# Patient Record
Sex: Female | Born: 1970 | Race: White | Hispanic: No | Marital: Single | Smoking: Never smoker
Health system: Southern US, Community
[De-identification: ages and names within clinical notes are randomized; demographics above are authoritative.]

## PROBLEM LIST (undated history)

## (undated) DIAGNOSIS — I639 Cerebral infarction, unspecified: Secondary | ICD-10-CM

## (undated) HISTORY — PX: KNEE SURGERY: SHX244

## (undated) HISTORY — PX: TONSILLECTOMY: SUR1361

---

## 2014-02-17 ENCOUNTER — Emergency Department (HOSPITAL_BASED_OUTPATIENT_CLINIC_OR_DEPARTMENT_OTHER)
Admission: EM | Admit: 2014-02-17 | Discharge: 2014-02-17 | Disposition: A | Discharge status: Home / Self Care | Payer: Worker's Compensation | Attending: Emergency Medicine | Admitting: Emergency Medicine

## 2014-02-17 ENCOUNTER — Encounter (HOSPITAL_BASED_OUTPATIENT_CLINIC_OR_DEPARTMENT_OTHER): Payer: Self-pay | Admitting: Emergency Medicine

## 2014-02-17 ENCOUNTER — Emergency Department (HOSPITAL_BASED_OUTPATIENT_CLINIC_OR_DEPARTMENT_OTHER): Payer: Worker's Compensation

## 2014-02-17 DIAGNOSIS — Y9389 Activity, other specified: Secondary | ICD-10-CM | POA: Insufficient documentation

## 2014-02-17 DIAGNOSIS — Y998 Other external cause status: Secondary | ICD-10-CM | POA: Diagnosis not present

## 2014-02-17 DIAGNOSIS — Y9289 Other specified places as the place of occurrence of the external cause: Secondary | ICD-10-CM | POA: Insufficient documentation

## 2014-02-17 DIAGNOSIS — S01411A Laceration without foreign body of right cheek and temporomandibular area, initial encounter: Secondary | ICD-10-CM | POA: Diagnosis not present

## 2014-02-17 DIAGNOSIS — W540XXA Bitten by dog, initial encounter: Secondary | ICD-10-CM | POA: Insufficient documentation

## 2014-02-17 HISTORY — DX: Cerebral infarction, unspecified: I63.9

## 2014-02-17 LAB — BASIC METABOLIC PANEL
Anion gap: 13 (ref 5–15)
BUN: 20 mg/dL (ref 6–23)
CO2: 25 mEq/L (ref 19–32)
Calcium: 9.3 mg/dL (ref 8.4–10.5)
Chloride: 102 mEq/L (ref 96–112)
Creatinine, Ser: 0.8 mg/dL (ref 0.50–1.10)
GFR calc Af Amer: >90 mL/min (ref >90)
GFR calc non Af Amer: 88 mL/min — ABNORMAL LOW (ref >90)
GLUCOSE: 102 mg/dL — AB (ref 70–99)
POTASSIUM: 4 meq/L (ref 3.7–5.3)
SODIUM: 140 meq/L (ref 137–147)

## 2014-02-17 LAB — CBC WITH DIFFERENTIAL/PLATELET
BASOS ABS: 0 10*3/uL (ref 0.0–0.1)
Basophils Relative: 0 % (ref 0–1)
EOS ABS: 0.4 10*3/uL (ref 0.0–0.7)
Eosinophils Relative: 4 % (ref 0–5)
HCT: 39.7 % (ref 36.0–46.0)
Hemoglobin: 13.3 g/dL (ref 12.0–15.0)
LYMPHS ABS: 1.3 10*3/uL (ref 0.7–4.0)
LYMPHS PCT: 16 % (ref 12–46)
MCH: 30 pg (ref 26.0–34.0)
MCHC: 33.5 g/dL (ref 30.0–36.0)
MCV: 89.6 fL (ref 78.0–100.0)
Monocytes Absolute: 0.4 10*3/uL (ref 0.1–1.0)
Monocytes Relative: 5 % (ref 3–12)
NEUTROS PCT: 75 % (ref 43–77)
Neutro Abs: 6.3 10*3/uL (ref 1.7–7.7)
PLATELETS: 255 10*3/uL (ref 150–400)
RBC: 4.43 MIL/uL (ref 3.87–5.11)
RDW: 12.3 % (ref 11.5–15.5)
WBC: 8.3 10*3/uL (ref 4.0–10.5)

## 2014-02-17 MED ORDER — TETANUS-DIPHTH-ACELL PERTUSSIS 5-2.5-18.5 LF-MCG/0.5 IM SUSP
0.5000 mL | Freq: Once | INTRAMUSCULAR | Status: AC
  Administered 2014-02-17: 0.5 mL via INTRAMUSCULAR
  Filled 2014-02-17: qty 0.5

## 2014-02-17 MED ORDER — MORPHINE SULFATE 4 MG/ML IJ SOLN
4.0000 mg | Freq: Once | INTRAMUSCULAR | Status: AC
  Administered 2014-02-17: 4 mg via INTRAVENOUS
  Filled 2014-02-17: qty 1

## 2014-02-17 MED ORDER — TRAMADOL HCL 50 MG PO TABS: 50.0000 mg | ORAL_TABLET | Freq: Four times a day (QID) | ORAL | Status: AC | PRN

## 2014-02-17 MED ORDER — MORPHINE SULFATE 4 MG/ML IJ SOLN
6.0000 mg | Freq: Once | INTRAMUSCULAR | Status: AC
  Administered 2014-02-17: 6 mg via INTRAVENOUS
  Filled 2014-02-17: qty 2

## 2014-02-17 MED ORDER — LIDOCAINE-EPINEPHRINE 1 %-1:100000 IJ SOLN
10.0000 mL | Freq: Once | INTRAMUSCULAR | Status: AC
  Administered 2014-02-17: 10 mL
  Filled 2014-02-17: qty 1

## 2014-02-17 MED ORDER — SODIUM CHLORIDE 0.9 % IV SOLN
3.0000 g | Freq: Once | INTRAVENOUS | Status: AC
  Administered 2014-02-17: 3 g via INTRAVENOUS
  Filled 2014-02-17: qty 3

## 2014-02-17 MED ORDER — KETOROLAC TROMETHAMINE 15 MG/ML IJ SOLN
15.0000 mg | Freq: Once | INTRAMUSCULAR | Status: AC
  Administered 2014-02-17: 15 mg via INTRAVENOUS
  Filled 2014-02-17: qty 1

## 2014-02-17 MED ORDER — ONDANSETRON HCL 4 MG/2ML IJ SOLN
4.0000 mg | Freq: Once | INTRAMUSCULAR | Status: AC
  Administered 2014-02-17: 4 mg via INTRAVENOUS
  Filled 2014-02-17: qty 2

## 2014-02-17 MED ORDER — AMOXICILLIN-POT CLAVULANATE 875-125 MG PO TABS: 1.0000 | ORAL_TABLET | Freq: Two times a day (BID) | ORAL | Status: AC

## 2014-02-17 MED ORDER — SODIUM CHLORIDE 0.9 % IV SOLN
INTRAVENOUS | Status: DC
  Administered 2014-02-17: 13:00:00 via INTRAVENOUS

## 2014-02-17 MED ORDER — FENTANYL CITRATE 0.05 MG/ML IJ SOLN
50.0000 ug | Freq: Once | INTRAMUSCULAR | Status: AC
  Administered 2014-02-17: 50 ug via INTRAVENOUS
  Filled 2014-02-17: qty 2

## 2014-02-17 NOTE — ED Notes (Signed)
Pt works at [employer] and was bit by [employer]-multiple lacs to face

## 2014-02-17 NOTE — ED Notes (Signed)
Updated patient and family on plan of care and time table for surgeon to arrive, patient in NAD at this time , family at bedside

## 2014-02-17 NOTE — ED Notes (Signed)
Per care[facility]: pt transfer from med center for suture of face following dog bite today, pt reports that she was bringing a dog from the vet when she was bit in the face by the dog. Dog was up to date with all vaccinations. Pt axo x4, face bandage and bleeding controlled. nad noted.

## 2014-02-17 NOTE — ED Provider Notes (Signed)
Medical screening examination/treatment/procedure(s) were performed by non-physician practitioner and as supervising physician I was immediately available for consultation/collaboration.   EKG Interpretation None        Rolan BuccoMelanie Siomara Burkel, MD 02/17/14 1615

## 2014-02-17 NOTE — ED Notes (Signed)
PA and this RN at bedside with patient

## 2014-02-17 NOTE — Discharge Instructions (Signed)
Keep the face clean and dry.    Animal Bite An animal bite can result in a scratch on the skin, deep open cut, puncture of the skin, crush injury, or tearing away of the skin or a body part. Dogs are responsible for most animal bites. Children are bitten more often than adults. An animal bite can range from very mild to more serious. A small bite from your house pet is no cause for alarm. However, some animal bites can become infected or injure a bone or other tissue. You must seek medical care if:  The skin is broken and bleeding does not slow down or stop after 15 minutes.  The puncture is deep and difficult to clean (such as a cat bite).  Pain, warmth, redness, or pus develops around the wound.  The bite is from a stray animal or rodent. There may be a risk of rabies infection.  The bite is from a snake, raccoon, skunk, fox, coyote, or bat. There may be a risk of rabies infection.  The person bitten has a chronic illness such as diabetes, liver disease, or cancer, or the person takes medicine that lowers the immune system.  There is concern about the location and severity of the bite. It is important to clean and protect an animal bite wound right away to prevent infection. Follow these steps:  Clean the wound with plenty of water and soap.  Apply an antibiotic cream.  Apply gentle pressure over the wound with a clean towel or gauze to slow or stop bleeding.  Elevate the affected area above the heart to help stop any bleeding.  Seek medical care. Getting medical care within 8 hours of the animal bite leads to the best possible outcome. DIAGNOSIS  Your caregiver will most likely:  Take a detailed history of the animal and the bite injury.  Perform a wound exam.  Take your medical history. Blood tests or X-rays may be performed. Sometimes, infected bite wounds are cultured and sent to a lab to identify the infectious bacteria.  TREATMENT  Medical treatment will depend on the  location and type of animal bite as well as the patient's medical history. Treatment may include:  Wound care, such as cleaning and flushing the wound with saline solution, bandaging, and elevating the affected area.  Antibiotics.  Tetanus immunization.  Rabies immunization.  Leaving the wound open to heal. This is often done with animal bites, due to the high risk of infection. However, in certain cases, wound closure with stitches, wound adhesive, skin adhesive strips, or staples may be used. Infected bites that are left untreated may require intravenous (IV) antibiotics and surgical treatment in the hospital. HOME CARE INSTRUCTIONS  Follow your caregiver's instructions for wound care.  Take all medicines as directed.  If your caregiver prescribes antibiotics, take them as directed. Finish them even if you start to feel better.  Follow up with your caregiver for further exams or immunizations as directed. You may need a tetanus shot if:  You cannot remember when you had your last tetanus shot.  You have never had a tetanus shot.  The injury broke your skin. If you get a tetanus shot, your arm may swell, get red, and feel warm to the touch. This is common and not a problem. If you need a tetanus shot and you choose not to have one, there is a rare chance of getting tetanus. Sickness from tetanus can be serious. SEEK MEDICAL CARE IF:  You notice  warmth, redness, soreness, swelling, pus discharge, or a bad smell coming from the wound.  You have a red line on the skin coming from the wound.  You have a fever, chills, or a general ill feeling.  You have nausea or vomiting.  You have continued or worsening pain.  You have trouble moving the injured part.  You have other questions or concerns. MAKE SURE YOU:  Understand these instructions.  Will watch your condition.  Will get help right away if you are not doing well or get worse. Document Released: 01/04/2011 Document  Revised: 07/11/2011 Document Reviewed: 01/04/2011 [facility] Patient Information 2015 [employer] [place]. This information is not intended to replace advice given to you by your health care provider. Make sure you discuss any questions you have with your health care provider.

## 2014-02-17 NOTE — ED Notes (Signed)
Removed bandages and reapplied to face with saline.

## 2014-02-17 NOTE — Consult Note (Signed)
Reason for Consult: Facial laceration Referring Physician: Virgel Manifold, MD  Bethany Bauer is an 43 y.o. female.  HPI: She was bitten by a dog earlier in the day at work. Multiple lacerations of the right side of the face.  Past Medical History  Diagnosis Date  . Stroke     Past Surgical History  Procedure Laterality Date  . Tonsillectomy    . Knee surgery      No family history on file.  Social History:  reports that she has never smoked. She does not have any smokeless tobacco history on file. She reports that she does not drink alcohol or use illicit drugs.  Allergies:  Allergies  Allergen Reactions  . Sulfa Antibiotics Rash    Medications: Reviewed  Results for orders placed during the hospital encounter of 02/17/14 (from the past 48 hour(s))  CBC WITH DIFFERENTIAL     Status: None   Collection Time    02/17/14 12:50 PM      Result Value Ref Range   WBC 8.3  4.0 - 10.5 K/uL   RBC 4.43  3.87 - 5.11 MIL/uL   Hemoglobin 13.3  12.0 - 15.0 g/dL   HCT 39.7  36.0 - 46.0 %   MCV 89.6  78.0 - 100.0 fL   MCH 30.0  26.0 - 34.0 pg   MCHC 33.5  30.0 - 36.0 g/dL   RDW 12.3  11.5 - 15.5 %   Platelets 255  150 - 400 K/uL   Neutrophils Relative % 75  43 - 77 %   Neutro Abs 6.3  1.7 - 7.7 K/uL   Lymphocytes Relative 16  12 - 46 %   Lymphs Abs 1.3  0.7 - 4.0 K/uL   Monocytes Relative 5  3 - 12 %   Monocytes Absolute 0.4  0.1 - 1.0 K/uL   Eosinophils Relative 4  0 - 5 %   Eosinophils Absolute 0.4  0.0 - 0.7 K/uL   Basophils Relative 0  0 - 1 %   Basophils Absolute 0.0  0.0 - 0.1 K/uL  BASIC METABOLIC PANEL     Status: Abnormal   Collection Time    02/17/14 12:50 PM      Result Value Ref Range   Sodium 140  137 - 147 mEq/L   Potassium 4.0  3.7 - 5.3 mEq/L   Chloride 102  96 - 112 mEq/L   CO2 25  19 - 32 mEq/L   Glucose, Bld 102 (*) 70 - 99 mg/dL   BUN 20  6 - 23 mg/dL   Creatinine, Ser 0.80  0.50 - 1.10 mg/dL   Calcium 9.3  8.4 - 10.5 mg/dL   GFR calc non Af Amer 88  (*) >90 mL/min   GFR calc Af Amer >90  >90 mL/min   Comment: (NOTE)     The eGFR has been calculated using the CKD EPI equation.     This calculation has not been validated in all clinical situations.     eGFR's persistently <90 mL/min signify possible Chronic Kidney     Disease.   Anion gap 13  5 - 15    Ct Maxillofacial Wo Cm  02/17/2014   CLINICAL DATA:  Dog bite in the right malar region with multiple lacerations  EXAM: CT MAXILLOFACIAL WITHOUT CONTRAST  TECHNIQUE: Multidetector CT imaging of the maxillofacial structures was performed. Multiplanar CT image reconstructions were also generated. A small metallic BB was placed on the right temple in order  to reliably differentiate right from left.  COMPARISON:  None.  FINDINGS: There is soft tissue swelling and soft tissue gas over the right malar region extending to the level of the right lip. The soft tissues of the nose are mildly edematous to the right of midline. The nasal bones and nasal septum are intact. The walls of the right maxillary sinus and of the right orbit are intact. The mandible is intact.  There is mucoperiosteal thickening within both maxillary sinuses greater on the right than on the left. A trace of fluid on the right is suspected as well. The other paranasal sinuses are clear. The soft tissues of the right orbit are intact. There is no pre- or postseptal edema.  IMPRESSION: 1. There is significant soft tissue injury over the right malar region with soft tissue gas noted. No underlying bony fracture is demonstrated. No orbital injury is demonstrated. 2. There is mucoperiosteal thickening within the maxillary sinuses without evidence of disruption of the bony sinus walls.   Electronically Signed   By: David  Martinique   On: 02/17/2014 13:52    ZOX:[id number] except as listed in admit H&P  Blood pressure 130/65, pulse 77, temperature 98 F (36.7 C), temperature source Oral, resp. rate 14, height 5' 6" (1.676 m), weight 70.308 kg  (155 lb), last menstrual period 02/15/2014, SpO2 99.00%.  PHYSICAL EXAM: Overall appearance:  Healthy appearing, in no distress Head:  Normocephalic, atraumatic. Ears: External ears look normal. Nose: External nose is healthy in appearance. Internal nasal exam free of any lesions or obstruction. Oral Cavity:  There are no mucosal lesions or masses identified. Face: Multiple lacerations involving the right side of the face. 3 of them were deep enough to require closure. The total length of the 3 lacerations is approximately 4 cm.  Neuro:  No identifiable neurologic deficits. Neck: No palpable neck masses.  Studies Reviewed: Maxillofacial CT  Procedures: Closure of these lacerations.  Procedure: All of the lacerations were infiltrated with 1% Xylocaine with epinephrine. 5-0 Monocryl was used to close all of them in a running fashion. There is no loss of tissue. The Monocryl was also used to ligate a small vessel in the most inferior laceration. Benzoin and Steri-Strips were applied on top as well to facilitate closure. She tolerated this well.   Assessment/Plan: Instructions given to keep everything clean. She is up-to-date on tetanus vaccination. She'll be prescribed Augmentin. She'll followup with me in one week.  [name] 02/17/2014, 7:55 PM

## 2014-02-17 NOTE — ED Notes (Signed)
Pt given sterile gauze to hold on face, small amt active bleeding noted from lacerations on face. Pt is in nad, states her pain is 5/10. Suture cart placed at bedside.

## 2014-02-17 NOTE — ED Provider Notes (Signed)
CSN: [id number]     Arrival date & time 02/17/14  1144 History   First MD Initiated Contact with Patient 02/17/14 1210     Chief Complaint  Patient presents with  . Animal Bite     (Consider location/radiation/quality/duration/timing/severity/associated sxs/prior Treatment) HPI  Patient to the ER with complaints of dog bite to the face by a Pit Mix. She was sitting in a car with the dog when it got scared and attacked her. She reports having significant pain and discomfort to the right cheek. She denies having any pain or injury to the eye, nose or inside of her mouth. Denies change of vision.  Past Medical History  Diagnosis Date  . Stroke    Past Surgical History  Procedure Laterality Date  . Tonsillectomy    . Knee surgery     No family history on file. History  Substance Use Topics  . Smoking status: Never Smoker   . Smokeless tobacco: Not on file  . Alcohol Use: No   OB History   Grav Para Term Preterm Abortions TAB SAB Ect Mult Living                 Review of Systems  All other systems reviewed and are negative.     Allergies  Sulfa antibiotics  Home Medications   Prior to Admission medications   Not on File   BP 151/82  Pulse 108  Temp(Src) 98.5 F (36.9 C) (Oral)  Resp 18  Ht 5\' 6"  (1.676 m)  Wt 155 lb (70.308 kg)  BMI 25.03 kg/m2  SpO2 97%  LMP 02/15/2014 Physical Exam  Nursing note and vitals reviewed. Constitutional: She appears well-developed and well-nourished. No distress.  HENT:  Head: Normocephalic and atraumatic.  Right Ear: Tympanic membrane and ear canal normal.  Left Ear: Tympanic membrane and ear canal normal.  Nose: Nose normal.  Mouth/Throat: Uvula is midline and oropharynx is clear and moist.  No intraoral lesions or injury. Multiple lacerations to right cheek without loss of tissue  Eyes: Conjunctivae, EOM and lids are normal. Pupils are equal, round, and reactive to light.  No injury to eye on left or right.  Neck:  Normal range of motion. Neck supple. No spinous process tenderness and no muscular tenderness present.  Cardiovascular: Normal rate and regular rhythm.   Pulmonary/Chest: Effort normal.  Abdominal: Soft.  Neurological: She is alert.  Skin: Skin is warm and dry.        ED Course  Procedures (including critical care time) Labs Review Labs Reviewed  BASIC METABOLIC PANEL - Abnormal; Notable for the following:    Glucose, Bld 102 (*)    GFR calc non Af Amer 88 (*)    All other components within normal limits  CBC WITH DIFFERENTIAL    Imaging Review No results found.   EKG Interpretation None      MDM   Final diagnoses:  Dog bite   Medications  0.9 %  sodium chloride infusion ( Intravenous New Bag/Given 02/17/14 1300)  ondansetron (ZOFRAN) injection 4 mg (4 mg Intravenous Given 02/17/14 1309)  fentaNYL (SUBLIMAZE) injection 50 mcg (50 mcg Intravenous Given 02/17/14 1310)  Tdap (BOOSTRIX) injection 0.5 mL (0.5 mLs Intramuscular Given 02/17/14 1312)     I spoke with Dr. Pollyann Kennedyosen of [facility] and [facility] WIll transfer her over. CT maxillofacial done here at [facility]. Please call Dr. Pollyann Kennedyosen when she arrives at the ED.  Filed Vitals:  02/17/14 1155  BP: 151/82  Pulse: 108  Temp: 98.5 F (36.9 C)  Resp: [id number]    Dorthula Matasiffany G Lynsi Dooner, PA-C 02/17/14 1336

## 2016-08-04 IMAGING — CT CT MAXILLOFACIAL W/O CM
3 series · 15 of 47 positions shown, 18 images · non-contrast
Comparison: None.

CLINICAL DATA: Dog bite in the right malar region with multiple
lacerations

EXAM:
CT MAXILLOFACIAL WITHOUT CONTRAST
TECHNIQUE: Multidetector CT imaging of the maxillofacial structures was
performed. Multiplanar CT image reconstructions were also generated.
A small metallic BB was placed on the right temple in order to
reliably differentiate right from left.

[Series 3: maxillofacial 2.0 h30s st · axial · 0.33mm/px · z∈[+1019,+1155]mm · 9 of 80 slices shown, 12 images]
[im 6/80  brain]
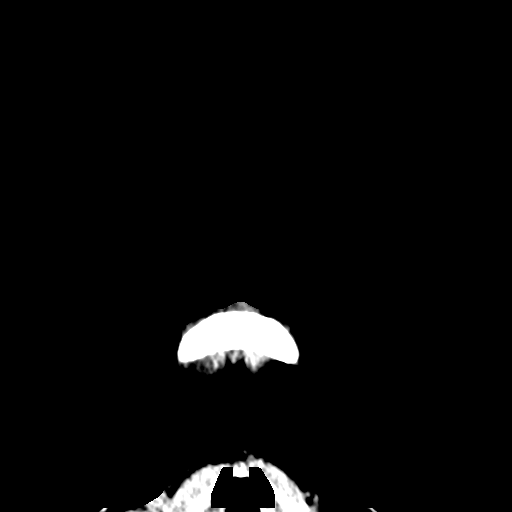
[im 6/80  bone]
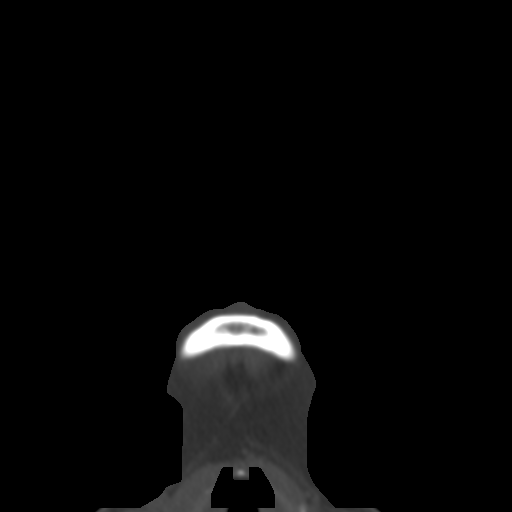
[im 14/80  bone]
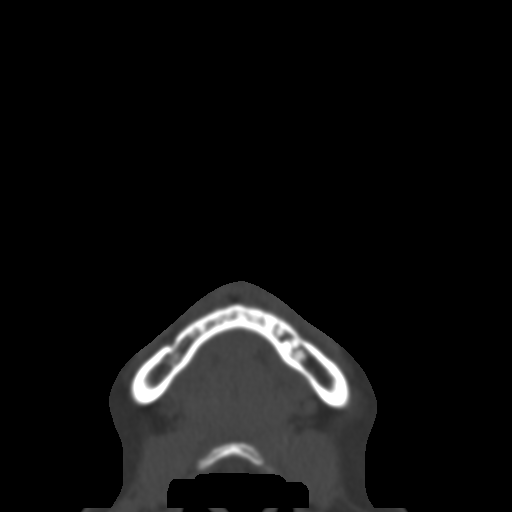
[im 22/80  bone]
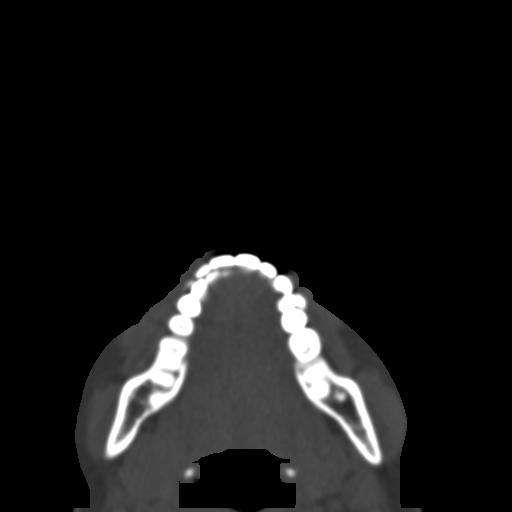
[im 30/80  bone]
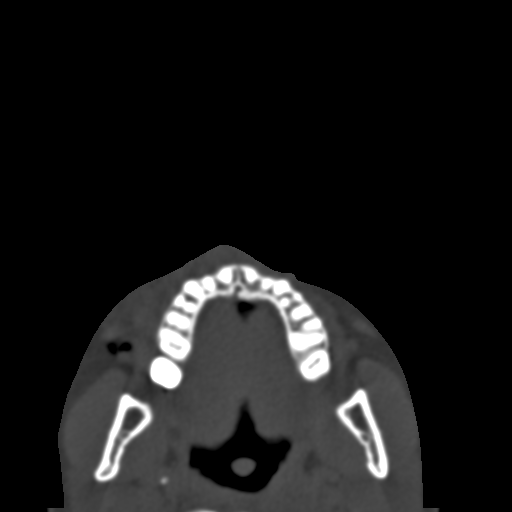
[im 41/80  brain]
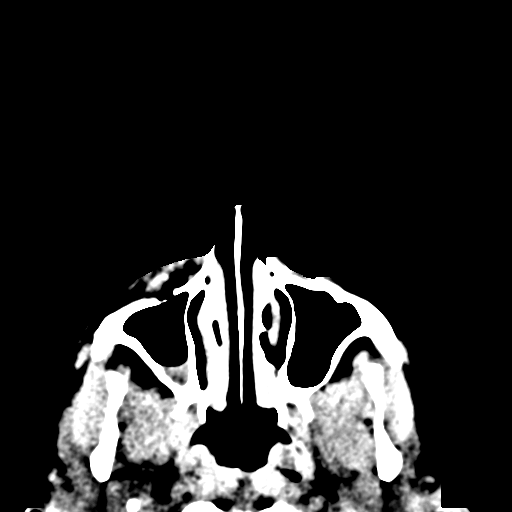
[im 41/80  bone]
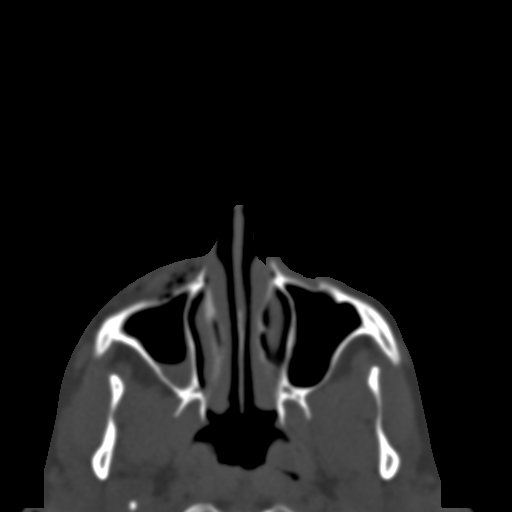
[im 50/80  bone]
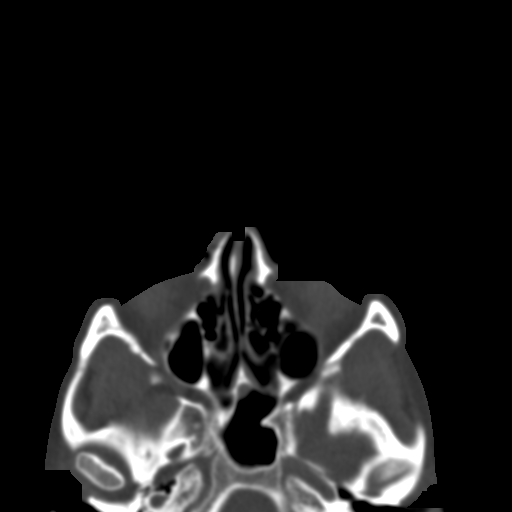
[im 58/80  bone]
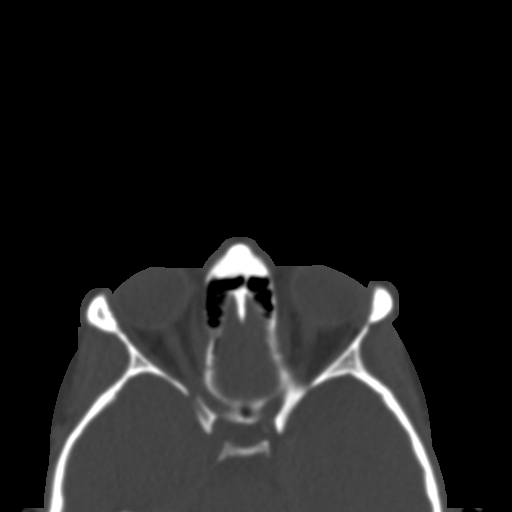
[im 66/80  bone]
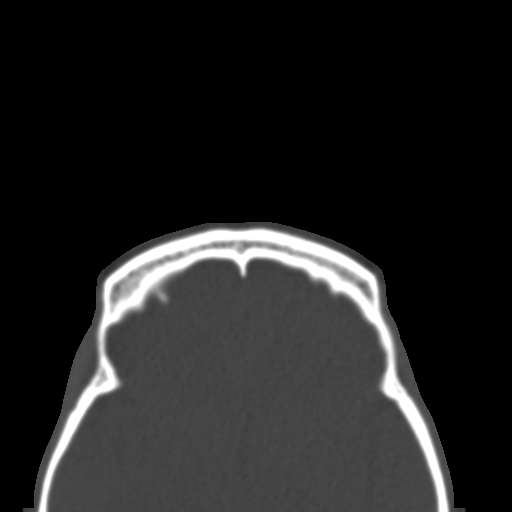
[im 74/80  brain]
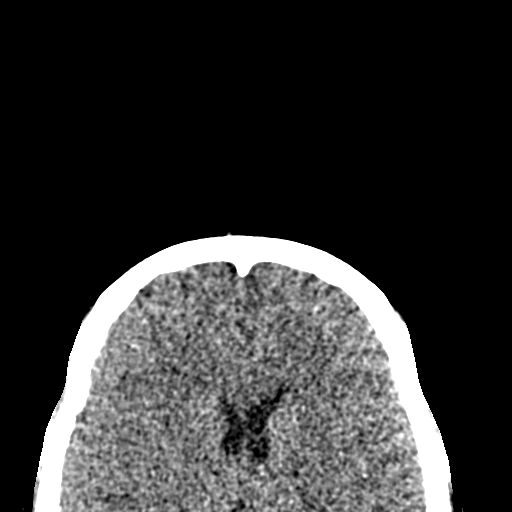
[im 74/80  bone]
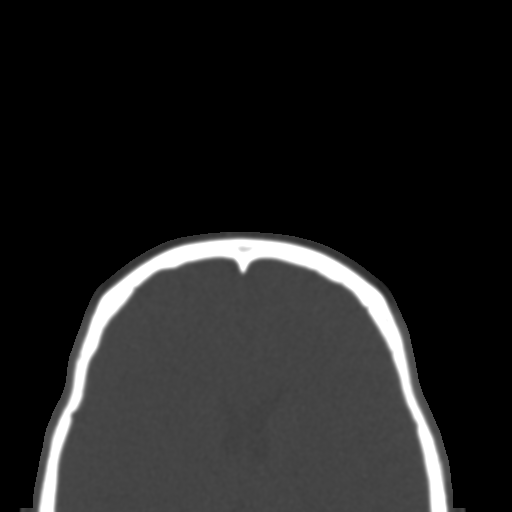

[Series 8: maxillofacial 2.0 coronal · coronal · 0.32mm/px · 3 of 63 slices shown]
[im 21/63  bone]
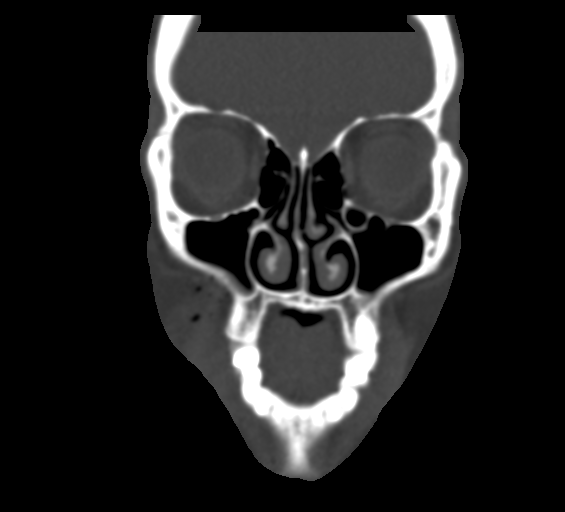
[im 28/63  bone]
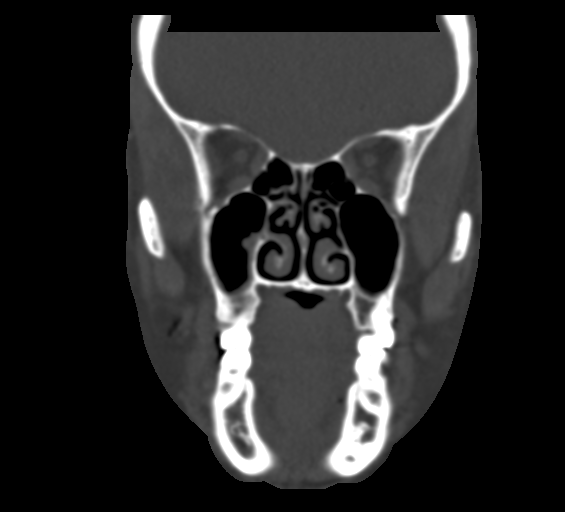
[im 35/63  bone]
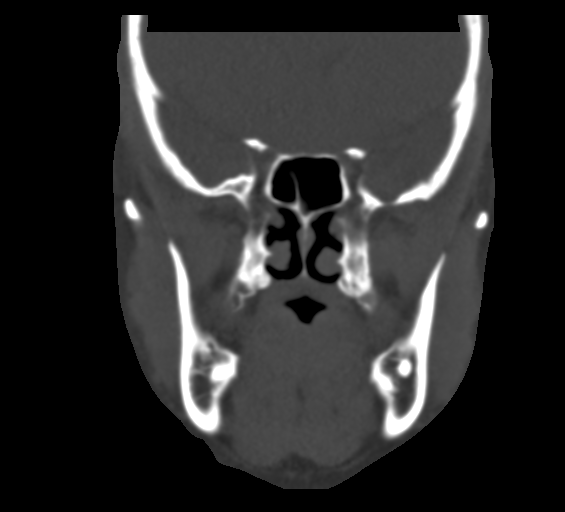

[Series 9: maxillofacial 2.0 sagittal · sagittal · 0.25mm/px · 3 of 74 slices shown]
[im 25/74  bone]
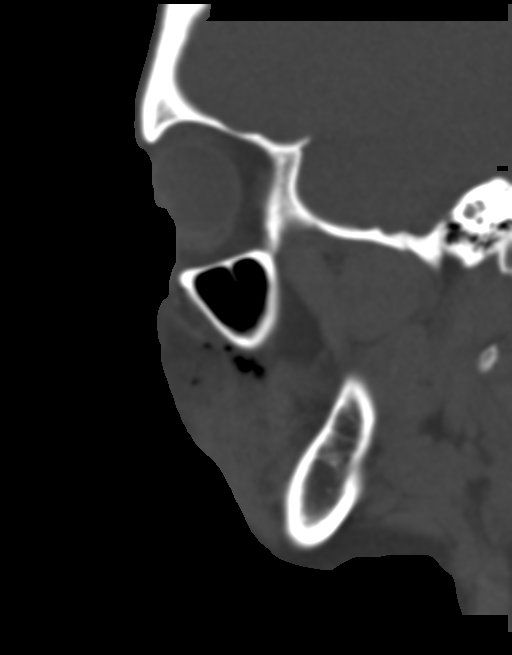
[im 37/74  bone]
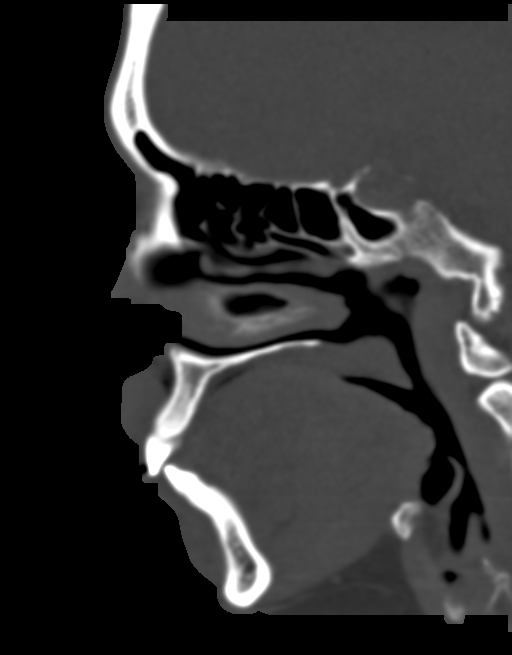
[im 49/74  bone]
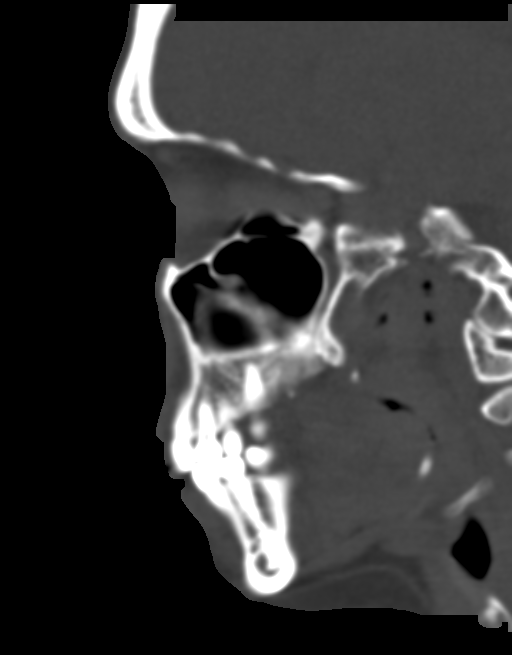

[15 of 47 positions shown; findings below may reference images not displayed]

FINDINGS: There is soft tissue swelling and soft tissue gas over the right
malar region extending to the level of the right lip. The soft
tissues of the nose are mildly edematous to the right of midline.
The nasal bones and nasal septum are intact. The walls of the right
maxillary sinus and of the right orbit are intact. The mandible is
intact.

There is mucoperiosteal thickening within both maxillary sinuses
greater on the right than on the left. A trace of fluid on the right
is suspected as well. The other paranasal sinuses are clear. The
soft tissues of the right orbit are intact. There is no pre- or
postseptal edema.
IMPRESSION: 1. There is significant soft tissue injury over the right malar
region with soft tissue gas noted. No underlying bony fracture is
demonstrated. No orbital injury is demonstrated.
2. There is mucoperiosteal thickening within the maxillary sinuses
without evidence of disruption of the bony sinus walls.
